# Patient Record
Sex: Male | Born: 1995 | Race: Black or African American | Hispanic: No | Marital: Single | Smoking: Current some day smoker
Health system: Southern US, Community
[De-identification: ages and names within clinical notes are randomized; demographics above are authoritative.]

---

## 2018-05-04 ENCOUNTER — Emergency Department: Payer: 59

## 2018-05-04 ENCOUNTER — Emergency Department
Admission: EM | Admit: 2018-05-04 | Discharge: 2018-05-04 | Disposition: A | Discharge status: Home / Self Care | Payer: 59 | Attending: Emergency Medicine | Admitting: Emergency Medicine

## 2018-05-04 DIAGNOSIS — F17200 Nicotine dependence, unspecified, uncomplicated: Secondary | ICD-10-CM | POA: Insufficient documentation

## 2018-05-04 DIAGNOSIS — J069 Acute upper respiratory infection, unspecified: Secondary | ICD-10-CM | POA: Insufficient documentation

## 2018-05-04 DIAGNOSIS — R0602 Shortness of breath: Secondary | ICD-10-CM | POA: Diagnosis present

## 2018-05-04 LAB — CBC
HEMATOCRIT: 41.3 % (ref 40.0–52.0)
HEMOGLOBIN: 13.8 g/dL (ref 13.0–18.0)
MCH: 27 pg (ref 26.0–34.0)
MCHC: 33.6 g/dL (ref 32.0–36.0)
MCV: 80.6 fL (ref 80.0–100.0)
Platelets: 172 10*3/uL (ref 150–440)
RBC: 5.12 MIL/uL (ref 4.40–5.90)
RDW: 13 % (ref 11.5–14.5)
WBC: 9.9 10*3/uL (ref 3.8–10.6)

## 2018-05-04 LAB — BASIC METABOLIC PANEL
ANION GAP: 13 (ref 5–15)
BUN: 13 mg/dL (ref 6–20)
CO2: 24 mmol/L (ref 22–32)
Calcium: 9.4 mg/dL (ref 8.9–10.3)
Chloride: 101 mmol/L (ref 101–111)
Creatinine, Ser: 0.93 mg/dL (ref 0.61–1.24)
GFR calc Af Amer: >60 mL/min (ref >60)
GLUCOSE: 81 mg/dL (ref 65–99)
Potassium: 3.4 mmol/L — ABNORMAL LOW (ref 3.5–5.1)
Sodium: 138 mmol/L (ref 135–145)

## 2018-05-04 LAB — TROPONIN I: Troponin I: <0.03 ng/mL (ref <0.03)

## 2018-05-04 MED ORDER — AMOXICILLIN-POT CLAVULANATE 875-125 MG PO TABS: 1.0000 | ORAL_TABLET | Freq: Two times a day (BID) | ORAL | 0 refills | Status: AC

## 2018-05-04 MED ORDER — ALBUTEROL SULFATE (2.5 MG/3ML) 0.083% IN NEBU
INHALATION_SOLUTION | RESPIRATORY_TRACT | Status: AC
  Administered 2018-05-04: 5 mg via RESPIRATORY_TRACT
  Filled 2018-05-04: qty 6

## 2018-05-04 MED ORDER — ALBUTEROL SULFATE (2.5 MG/3ML) 0.083% IN NEBU
5.0000 mg | INHALATION_SOLUTION | Freq: Once | RESPIRATORY_TRACT | Status: AC
  Administered 2018-05-04: 5 mg via RESPIRATORY_TRACT

## 2018-05-04 MED ORDER — PREDNISONE 20 MG PO TABS: 40.0000 mg | ORAL_TABLET | Freq: Every day | ORAL | 0 refills | Status: DC

## 2018-05-04 NOTE — ED Triage Notes (Signed)
Patient c/o SOB, cough, congestion, and chest tightness with deep inspiration.

## 2018-05-04 NOTE — ED Provider Notes (Signed)
[facility] Emergency Department Provider Note  Time seen: 10:10 PM  I have reviewed the triage vital signs and the nursing notes.   HISTORY  Chief Complaint Shortness of Breath    HPI Marc Roberts is a 22 y.o. male with no past medical history who presents to the emergency department for cough congestion and chest tightness.  According to the patient for the past 3 4 days he has been experiencing shortness of breath, congestion and occasional wheeze.  Patient states significant nasal congestion, chills at home but has not measured a fever.  Patient states for the last 2 days he has been having some chest tightness, feels like he cannot get a full deep breath of air.  Denies any chest pain.  Largely negative review of systems otherwise.  History reviewed. No pertinent past medical history.  There are no active problems to display for this patient.   History reviewed. No pertinent surgical history.  Prior to Admission medications   Not on File    No Known Allergies  No family history on file.  Social History Social History   Tobacco Use  . Smoking status: Current Some Day Smoker  . Smokeless tobacco: Never Used  Substance Use Topics  . Alcohol use: Never    Frequency: Never  . Drug use: Not on file    Review of Systems Constitutional: Negative for fever.  Positive for chills Eyes: Negative for visual complaints ENT: Positive for congestion Cardiovascular: Negative for chest pain.  Positive for chest tightness Respiratory: Mild shortness of breath, frequent cough. Gastrointestinal: Negative for abdominal pain, vomiting Genitourinary: Negative for urinary compaints Musculoskeletal: Negative for leg pain or swelling Skin: Negative for skin complaints  Neurological: Negative for headache All other ROS negative  ____________________________________________   PHYSICAL EXAM:  Constitutional: Alert and oriented. Well appearing and in no  distress. Eyes: Normal exam ENT   Head: Normocephalic and atraumatic   Mouth/Throat: Mucous membranes are moist. Cardiovascular: Normal rate, regular rhythm. No murmur Respiratory: Normal respiratory effort without tachypnea nor retractions. Breath sounds are clear (status post breathing treatment) Gastrointestinal: Soft and nontender. No distention.   Musculoskeletal: Nontender with normal range of motion in all extremities. No lower extremity tenderness or edema. Neurologic:  Normal speech and language. No gross focal neurologic deficits  Skin:  Skin is warm, dry and intact.  Psychiatric: Mood and affect are normal.   ____________________________________________    EKG  EKG reviewed and interpreted by myself shows normal sinus rhythm 86 bpm with a narrow QRS, normal axis, normal intervals, no concerning ST changes.  ____________________________________________    RADIOLOGY  Normal chest x-ray  ____________________________________________   INITIAL IMPRESSION / ASSESSMENT AND PLAN / ED COURSE  Pertinent labs & imaging results that were available during my care of the patient were reviewed by me and considered in my medical decision making (see chart for details).  Patient presents to the emergency department for shortness of breath cough and congestion.  Differential would include upper respiratory infection, reactive airway disease, sinusitis.  Overall patient appears well, has clear lung sounds after breathing treatment, but was complaining of wheeze at home we will cover with steroids for possible reactive airway disease.  Given the patient's congestion chills and continued cough we will cover with a short course of antibiotics as a precaution.  Overall the patient does appear well we will have the patient follow-up with his primary care doctor.  Patient and family agreeable to plan of care.  Patient's  labs including cardiac enzymes are normal.  Chest x-ray is normal.   EKG is reassuring.  ____________________________________________   FINAL CLINICAL IMPRESSION(S) / ED DIAGNOSES  Upper respiratory infection Dyspnea    Minna Antis, MD 05/04/18 2213

## 2018-05-16 DIAGNOSIS — J329 Chronic sinusitis, unspecified: Secondary | ICD-10-CM | POA: Diagnosis not present

## 2018-05-16 DIAGNOSIS — F17200 Nicotine dependence, unspecified, uncomplicated: Secondary | ICD-10-CM | POA: Insufficient documentation

## 2018-05-16 DIAGNOSIS — R51 Headache: Secondary | ICD-10-CM | POA: Diagnosis present

## 2018-05-16 DIAGNOSIS — Z79899 Other long term (current) drug therapy: Secondary | ICD-10-CM | POA: Insufficient documentation

## 2018-05-16 NOTE — ED Triage Notes (Signed)
Pt presents via POV c/o right orbital pain extending into the back of skull x4 days. Reports pain as constant. Pt reports taking Aleve without resolution. Ambulatory to triage. Denies blurred vision/diziness.

## 2018-05-17 ENCOUNTER — Emergency Department
Admission: EM | Admit: 2018-05-17 | Discharge: 2018-05-17 | Disposition: A | Discharge status: Home / Self Care | Payer: 59 | Attending: Emergency Medicine | Admitting: Emergency Medicine

## 2018-05-17 ENCOUNTER — Emergency Department: Payer: 59

## 2018-05-17 DIAGNOSIS — R519 Headache, unspecified: Secondary | ICD-10-CM

## 2018-05-17 DIAGNOSIS — R51 Headache: Secondary | ICD-10-CM

## 2018-05-17 MED ORDER — AMOXICILLIN-POT CLAVULANATE 875-125 MG PO TABS: 1.0000 | ORAL_TABLET | Freq: Two times a day (BID) | ORAL | 0 refills | Status: AC

## 2018-05-17 NOTE — ED Notes (Signed)
Patient ambulatory to lobby with steady gait and NAD noted. Verbalized understanding of discharge instructions and follow-up care.  

## 2018-05-18 NOTE — ED Provider Notes (Signed)
[facility] Emergency Department Provider Note    First MD Initiated Contact with Patient 05/17/18 0113     (approximate)  I have reviewed the triage vital signs and the nursing notes.   HISTORY  Chief Complaint Headache and Facial Pain    HPI Marc Roberts is a 22 y.o. male presents to the emergency department with a 4-day history of right periorbital headache extending to the occipital region.  Patient denies any fever no neck pain or stiffness.  Patient denies any visual changes.  Patient denies any tearing from the right eye.  Patient denies any weakness numbness gait instability.  Patient's parents at bedside denies any familial history of migraine aneurysms or tumors.  Patient does admit to recent congestion   Past medical history None There are no active problems to display for this patient.   History reviewed. No pertinent surgical history.  Prior to Admission medications   Medication Sig Start Date End Date Taking? Authorizing Provider  amoxicillin-clavulanate (AUGMENTIN) 875-125 MG tablet Take 1 tablet by mouth 2 (two) times daily for 10 days. 05/17/18 05/27/18  Darci Current, MD  predniSONE (DELTASONE) 20 MG tablet Take 2 tablets (40 mg total) by mouth daily. 05/04/18   Minna Antis, MD    Allergies No known drug allergies History reviewed. No pertinent family history.  Social History Social History   Tobacco Use  . Smoking status: Current Some Day Smoker  . Smokeless tobacco: Never Used  Substance Use Topics  . Alcohol use: Never    Frequency: Never  . Drug use: Not on file    Review of Systems Constitutional: No fever/chills Eyes: No visual changes. ENT: No sore throat. Cardiovascular: Denies chest pain. Respiratory: Denies shortness of breath. Gastrointestinal: No abdominal pain.  No nausea, no vomiting.  No diarrhea.  No constipation. Genitourinary: Negative for dysuria. Musculoskeletal: Negative for neck pain.   Negative for back pain. Integumentary: Negative for rash. Neurological: Positive for headaches, negative for focal weakness or numbness.   ____________________________________________   PHYSICAL EXAM:  VITAL SIGNS: ED Triage Vitals  Enc Vitals Group     BP 05/16/18 1934 125/73     Pulse Rate 05/16/18 1934 (!) 57     Resp 05/16/18 1934 14     Temp 05/16/18 1934 98.5 F (36.9 C)     Temp src --      SpO2 05/16/18 1934 100 %     Weight 05/16/18 1935 61.2 kg (135 lb)     Height 05/16/18 1935 1.727 m ( )     Head Circumference --      Peak Flow --      Pain Score 05/16/18 1934 10     Pain Loc --      Pain Edu? --      Excl. in GC? --     Constitutional: Alert and oriented. Well appearing and in no acute distress. Eyes: Conjunctivae are normal. PERRL. EOMI. Head: Atraumatic. Mouth/Throat: Mucous membranes are moist. Oropharynx non-erythematous. Neck: No stridor.  No meningeal signs.  Cardiovascular: Normal rate, regular rhythm. Good peripheral circulation. Grossly normal heart sounds. Respiratory: Normal respiratory effort.  No retractions. Lungs CTAB. Gastrointestinal: Soft and nontender. No distention.  Musculoskeletal: No lower extremity tenderness nor edema. No gross deformities of extremities. Neurologic:  Normal speech and language. No gross focal neurologic deficits are appreciated.  Skin:  Skin is warm, dry and intact. No rash noted. Psychiatric: Mood and affect are normal. Speech and behavior are normal.  _________________________________________  RADIOLOGY I, Darci Current, personally viewed and evaluated these images (plain radiographs) as part of my medical decision making, as well as reviewing the written report by the radiologist.  ED MD interpretation: CT scan of the head revealed partial opacification of the right frontal and left sphenoid sinus radiologist  Official radiology report(s): No results  found.    Procedures   ____________________________________________   INITIAL IMPRESSION / ASSESSMENT AND PLAN / ED COURSE  As part of my medical decision making, I reviewed the following data within the electronic MEDICAL RECORD NUMBER   22 year old male presented with above-stated history and physical exam concerning for sinus headache versus migraine versus other potential intracranial pathology and as such CT scan of the head was performed which revealed opacification of the right frontal sinus consistent with the area of the patient's headache.  Spoke with the patient and his parents at length regarding sinus headaches ____________________________________________  FINAL CLINICAL IMPRESSION(S) / ED DIAGNOSES  Final diagnoses:  Sinus headache     MEDICATIONS GIVEN DURING THIS VISIT:  Medications - No data to display   ED Discharge Orders        Ordered    amoxicillin-clavulanate (AUGMENTIN) 875-125 MG tablet  2 times daily     05/17/18 0217       Note:  This document was prepared using Dragon voice recognition software and may include unintentional dictation errors.    Darci Current, MD 05/18/18 [phone]

## 2020-12-30 ENCOUNTER — Other Ambulatory Visit: Payer: Self-pay

## 2020-12-30 ENCOUNTER — Encounter (HOSPITAL_COMMUNITY): Payer: Self-pay

## 2020-12-30 ENCOUNTER — Emergency Department (HOSPITAL_COMMUNITY)
Admission: EM | Admit: 2020-12-30 | Discharge: 2020-12-30 | Disposition: A | Discharge status: Home / Self Care | Payer: 59 | Attending: Emergency Medicine | Admitting: Emergency Medicine

## 2020-12-30 DIAGNOSIS — Z5321 Procedure and treatment not carried out due to patient leaving prior to being seen by health care provider: Secondary | ICD-10-CM | POA: Insufficient documentation

## 2020-12-30 DIAGNOSIS — R21 Rash and other nonspecific skin eruption: Secondary | ICD-10-CM | POA: Diagnosis not present

## 2020-12-30 NOTE — ED Triage Notes (Signed)
Pt sts dry itchy skin around head, neck and spreading to arms. Pt concerned of psoriasis.

## 2020-12-30 NOTE — ED Notes (Signed)
Pt called 3x for room placement. Eloped from waiting area.  

## 2022-05-28 ENCOUNTER — Emergency Department
Admission: EM | Admit: 2022-05-28 | Discharge: 2022-05-28 | Disposition: A | Discharge status: Home / Self Care | Payer: 59 | Attending: Emergency Medicine | Admitting: Emergency Medicine

## 2022-05-28 ENCOUNTER — Emergency Department: Payer: 59

## 2022-05-28 DIAGNOSIS — M79661 Pain in right lower leg: Secondary | ICD-10-CM | POA: Insufficient documentation

## 2022-05-28 DIAGNOSIS — S62616A Displaced fracture of proximal phalanx of right little finger, initial encounter for closed fracture: Secondary | ICD-10-CM | POA: Insufficient documentation

## 2022-05-28 DIAGNOSIS — R519 Headache, unspecified: Secondary | ICD-10-CM | POA: Insufficient documentation

## 2022-05-28 DIAGNOSIS — Y92410 Unspecified street and highway as the place of occurrence of the external cause: Secondary | ICD-10-CM | POA: Diagnosis not present

## 2022-05-28 DIAGNOSIS — S161XXA Strain of muscle, fascia and tendon at neck level, initial encounter: Secondary | ICD-10-CM | POA: Insufficient documentation

## 2022-05-28 DIAGNOSIS — M79604 Pain in right leg: Secondary | ICD-10-CM

## 2022-05-28 DIAGNOSIS — S6991XA Unspecified injury of right wrist, hand and finger(s), initial encounter: Secondary | ICD-10-CM | POA: Diagnosis present

## 2022-05-28 DIAGNOSIS — S62619A Displaced fracture of proximal phalanx of unspecified finger, initial encounter for closed fracture: Secondary | ICD-10-CM

## 2022-05-28 MED ORDER — NAPROXEN 500 MG PO TABS: 500.0000 mg | ORAL_TABLET | Freq: Two times a day (BID) | ORAL | 0 refills | Status: DC

## 2022-05-28 MED ORDER — CYCLOBENZAPRINE HCL 10 MG PO TABS: 10.0000 mg | ORAL_TABLET | Freq: Three times a day (TID) | ORAL | 0 refills | Status: DC | PRN
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
## 2022-11-29 ENCOUNTER — Emergency Department: Payer: 59

## 2022-11-29 ENCOUNTER — Encounter: Payer: Self-pay | Admitting: Emergency Medicine

## 2022-11-29 ENCOUNTER — Emergency Department
Admission: EM | Admit: 2022-11-29 | Discharge: 2022-11-29 | Disposition: A | Discharge status: Home / Self Care | Payer: 59 | Attending: Emergency Medicine | Admitting: Emergency Medicine

## 2022-11-29 DIAGNOSIS — R0981 Nasal congestion: Secondary | ICD-10-CM | POA: Diagnosis present

## 2022-11-29 DIAGNOSIS — Z1152 Encounter for screening for COVID-19: Secondary | ICD-10-CM | POA: Diagnosis not present

## 2022-11-29 DIAGNOSIS — J101 Influenza due to other identified influenza virus with other respiratory manifestations: Secondary | ICD-10-CM | POA: Insufficient documentation

## 2022-11-29 LAB — RESP PANEL BY RT-PCR (FLU A&B, COVID) ARPGX2
Influenza A by PCR: POSITIVE — AB
Influenza B by PCR: NEGATIVE
SARS Coronavirus 2 by RT PCR: NEGATIVE

## 2022-11-29 MED ORDER — ACETAMINOPHEN 500 MG PO TABS
1000.0000 mg | ORAL_TABLET | Freq: Once | ORAL | Status: AC
  Administered 2022-11-29: 1000 mg via ORAL
  Filled 2022-11-29: qty 2

## 2022-11-29 MED ORDER — ONDANSETRON 4 MG PO TBDP: 4.0000 mg | ORAL_TABLET | Freq: Three times a day (TID) | ORAL | 0 refills | Status: AC | PRN

## 2022-11-29 MED ORDER — BENZONATATE 100 MG PO CAPS: 100.0000 mg | ORAL_CAPSULE | Freq: Three times a day (TID) | ORAL | 0 refills | Status: AC | PRN

## 2022-11-29 NOTE — ED Provider Notes (Signed)
[facility] Provider Note  Patient Contact: 10:39 PM (approximate)   History   Nasal Congestion   HPI  Marc Roberts is a 26 y.o. male presents to the emergency department with headache, body aches, nausea and vomiting for the past 2 days.  No chest pain, chest tightness or abdominal pain.  No shortness of breath.  No sick contacts in the home with similar symptoms.      Physical Exam   Triage Vital Signs: ED Triage Vitals  Enc Vitals Group     BP 11/29/22 1912 (!) 155/68     Pulse Rate 11/29/22 1912 100     Resp 11/29/22 1912 20     Temp 11/29/22 1912 (!) 102 F (38.9 C)     Temp src --      SpO2 11/29/22 1912 100 %     Weight 11/29/22 1907 150 lb (68 kg)     Height 11/29/22 1907 5\' 8"  (1.727 m)     Head Circumference --      Peak Flow --      Pain Score 11/29/22 1912 6     Pain Loc --      Pain Edu? --      Excl. in GC? --     Most recent vital signs: Vitals:   11/29/22 1912  BP: (!) 155/68  Pulse: 100  Resp: 20  Temp: (!) 102 F (38.9 C)  SpO2: 100%     Constitutional: Alert and oriented. Patient is lying supine. Eyes: Conjunctivae are normal. PERRL. EOMI. Head: Atraumatic. ENT:      Ears: Tympanic membranes are mildly injected with mild effusion bilaterally.       Nose: No congestion/rhinnorhea.      Mouth/Throat: Mucous membranes are moist. Posterior pharynx is mildly erythematous.  Hematological/Lymphatic/Immunilogical: No cervical lymphadenopathy.  Cardiovascular: Normal rate, regular rhythm. Normal S1 and S2.  Good peripheral circulation. Respiratory: Normal respiratory effort without tachypnea or retractions. Lungs CTAB. Good air entry to the bases with no decreased or absent breath sounds. Gastrointestinal: Bowel sounds 4 quadrants. Soft and nontender to palpation. No guarding or rigidity. No palpable masses. No distention. No CVA tenderness. Musculoskeletal: Full range of motion to all extremities. No gross  deformities appreciated. Neurologic:  Normal speech and language. No gross focal neurologic deficits are appreciated.  Skin:  Skin is warm, dry and intact. No rash noted. Psychiatric: Mood and affect are normal. Speech and behavior are normal. Patient exhibits appropriate insight and judgement.   ED Results / Procedures / Treatments   Labs (all labs ordered are listed, but only abnormal results are displayed) Labs Reviewed  RESP PANEL BY RT-PCR (FLU A&B, COVID) ARPGX2 - Abnormal; Notable for the following components:      Result Value   Influenza A by PCR POSITIVE (*)    All other components within normal limits        RADIOLOGY  I personally viewed and evaluated these images as part of my medical decision making, as well as reviewing the written report by the radiologist.  ED Provider Interpretation: No acute abnormality on chest x-ray.   PROCEDURES:  Critical Care performed: No  Procedures   MEDICATIONS ORDERED IN ED: Medications  acetaminophen (TYLENOL) tablet 1,000 mg (1,000 mg Oral Given 11/29/22 1910)     IMPRESSION / MDM / ASSESSMENT AND PLAN / ED COURSE  I reviewed the triage vital signs and the nursing notes.  Assessment and plan Influenza A 26 year old male presents to the emergency department with flulike symptoms.  He tested positive for influenza A.  Patient was hypertensive at triage febrile with mild tachycardia.  Vitals improved with antipyretics given in the emergency department.  Recommended Zofran, Tessalon Perles, Tylenol and ibuprofen alternating for fever and bodyaches.  Return precautions were given to return with new or worsening symptoms.      FINAL CLINICAL IMPRESSION(S) / ED DIAGNOSES   Final diagnoses:  Influenza A     Rx / DC Orders   ED Discharge Orders          Ordered    ondansetron (ZOFRAN-ODT) 4 MG disintegrating tablet  Every 8 hours PRN        11/29/22 2236    benzonatate (TESSALON  PERLES) 100 MG capsule  3 times daily PRN        11/29/22 2236             Note:  This document was prepared using Dragon voice recognition software and may include unintentional dictation errors.   Pia [place], PA-C 11/29/22 2241    Sharman Cheek, MD 11/29/22 2308

## 2022-11-29 NOTE — Discharge Instructions (Signed)
Alternate Tylenol and ibuprofen for fever. You can take Tessalon Perles for cough. Take ODT Zofran for nausea and vomiting.

## 2022-11-29 NOTE — ED Provider Triage Note (Signed)
Emergency Medicine Provider Triage Evaluation Note  Marc Roberts , a 26 y.o. male  was evaluated in triage.  Pt complains of cough, body aches, fever  x 2 days.  Review of Systems  Positive: Cough, body aches, congestion, fever Negative: CP, shob  Physical Exam  BP (!) 155/68 (BP Location: Left Arm)   Pulse 100   Temp (!) 102 F (38.9 C)   Resp 20   Ht 5\' 8"  (1.727 m)   Wt 68 kg   SpO2 100%   BMI 22.81 kg/m  Gen:   Awake, no distress   Resp:  Normal effort  MSK:   Moves extremities without difficulty  Other:    Medical Decision Making  Medically screening exam initiated at 7:13 PM.  Appropriate orders placed.  Blanchard Willhite was informed that the remainder of the evaluation will be completed by another provider, this initial triage assessment does not replace that evaluation, and the importance of remaining in the ED until their evaluation is complete.  Swab, xray   Frederico Hamman, PA-C 11/29/22 1913

## 2022-11-29 NOTE — ED Triage Notes (Signed)
Pt presents via POV with complaints of nasal congestion, cough, general body aches since last night. No meds taken PTA. Denies CP or SOB.
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
## 2023-03-14 ENCOUNTER — Ambulatory Visit: Payer: 59 | Admitting: Family

## 2023-03-24 ENCOUNTER — Encounter: Payer: Self-pay | Admitting: Family

## 2023-03-24 ENCOUNTER — Ambulatory Visit: Payer: 59 | Admitting: Family

## 2023-03-24 VITALS — BP 116/76 | HR 67 | Temp 98.0°F | Ht 68.0 in | Wt 143.1 lb

## 2023-03-24 DIAGNOSIS — M5412 Radiculopathy, cervical region: Secondary | ICD-10-CM

## 2023-03-24 DIAGNOSIS — M5416 Radiculopathy, lumbar region: Secondary | ICD-10-CM

## 2023-03-24 MED ORDER — NAPROXEN 500 MG PO TABS: 500.0000 mg | ORAL_TABLET | Freq: Two times a day (BID) | ORAL | 0 refills | Status: DC

## 2023-03-24 MED ORDER — CYCLOBENZAPRINE HCL 10 MG PO TABS: 10.0000 mg | ORAL_TABLET | Freq: Three times a day (TID) | ORAL | 0 refills | Status: DC | PRN

## 2023-03-24 NOTE — Patient Instructions (Addendum)
Welcome to [facility] [employer], It was a pleasure meeting you today!    As discussed, I have sent your pain med refills to your pharmacy.  I have sent a referral to [facility] in [place].    PLEASE NOTE: If you had any LAB tests please let us know if you have not heard back within a few days. You may see your results on MyChart before we have a chance to review them but we will give you a call once they are reviewed by [place]. If we ordered any REFERRALS today, please let us know if you have not heard from their office within the next week.  Let us know through MyChart if you are needing REFILLS, or have your pharmacy send [place] the request. You can also use MyChart to communicate with me or any office staff.  Please try these tips to maintain a healthy lifestyle: It is important that you exercise regularly at least 30 minutes 5 times a week. Think about what you will eat, plan ahead. Choose whole foods, & think  "clean, green, fresh or frozen" over canned, processed or packaged foods which are more sugary, salty, and fatty. 70 to 75% of food eaten should be fresh vegetables and protein. 2-3  meals daily with healthy snacks between meals, but must be whole fruit, protein or vegetables. Aim to eat over a 10 hour period when you are active, for example, 7am to 5pm, and then STOP after your last meal of the day, drinking only water.  Shorter eating windows, 6-8 hours, are showing benefits in heart disease and blood sugar regulation. Drink water every day! Shoot for 64 ounces daily = 8 cups, no other drink is as healthy! Fruit juice is best enjoyed in a healthy way, by EATING the fruit.
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
## 2023-05-15 ENCOUNTER — Emergency Department (HOSPITAL_COMMUNITY): Payer: 59

## 2023-05-15 ENCOUNTER — Other Ambulatory Visit: Payer: Self-pay

## 2023-05-15 ENCOUNTER — Encounter (HOSPITAL_COMMUNITY): Payer: Self-pay | Admitting: *Deleted

## 2023-05-15 ENCOUNTER — Emergency Department (HOSPITAL_COMMUNITY)
Admission: EM | Admit: 2023-05-15 | Discharge: 2023-05-15 | Disposition: A | Discharge status: Home / Self Care | Payer: 59 | Attending: Emergency Medicine | Admitting: Emergency Medicine

## 2023-05-15 DIAGNOSIS — J9801 Acute bronchospasm: Secondary | ICD-10-CM | POA: Diagnosis not present

## 2023-05-15 DIAGNOSIS — R0602 Shortness of breath: Secondary | ICD-10-CM | POA: Diagnosis present

## 2023-05-15 LAB — BASIC METABOLIC PANEL
Anion gap: 11 (ref 5–15)
BUN: 10 mg/dL (ref 6–20)
CO2: 24 mmol/L (ref 22–32)
Calcium: 9.1 mg/dL (ref 8.9–10.3)
Chloride: 105 mmol/L (ref 98–111)
Creatinine, Ser: 0.89 mg/dL (ref 0.61–1.24)
GFR, Estimated: >60 mL/min (ref >60)
Glucose, Bld: 95 mg/dL (ref 70–99)
Potassium: 3.7 mmol/L (ref 3.5–5.1)
Sodium: 140 mmol/L (ref 135–145)

## 2023-05-15 LAB — CBC WITH DIFFERENTIAL/PLATELET
Abs Immature Granulocytes: 0.02 10*3/uL (ref 0.00–0.07)
Basophils Absolute: 0.1 10*3/uL (ref 0.0–0.1)
Basophils Relative: 1 %
Eosinophils Absolute: 0.6 10*3/uL — ABNORMAL HIGH (ref 0.0–0.5)
Eosinophils Relative: 7 %
HCT: 43.6 % (ref 39.0–52.0)
Hemoglobin: 13.7 g/dL (ref 13.0–17.0)
Immature Granulocytes: 0 %
Lymphocytes Relative: 36 %
Lymphs Abs: 2.9 10*3/uL (ref 0.7–4.0)
MCH: 26.4 pg (ref 26.0–34.0)
MCHC: 31.4 g/dL (ref 30.0–36.0)
MCV: 84.2 fL (ref 80.0–100.0)
Monocytes Absolute: 0.5 10*3/uL (ref 0.1–1.0)
Monocytes Relative: 6 %
Neutro Abs: 3.8 10*3/uL (ref 1.7–7.7)
Neutrophils Relative %: 50 %
Platelets: 240 10*3/uL (ref 150–400)
RBC: 5.18 MIL/uL (ref 4.22–5.81)
RDW: 13.1 % (ref 11.5–15.5)
WBC: 7.9 10*3/uL (ref 4.0–10.5)
nRBC: 0 % (ref 0.0–0.2)

## 2023-05-15 MED ORDER — ALBUTEROL SULFATE HFA 108 (90 BASE) MCG/ACT IN AERS
2.0000 | INHALATION_SPRAY | Freq: Once | RESPIRATORY_TRACT | Status: AC
  Administered 2023-05-15: 2 via RESPIRATORY_TRACT
  Filled 2023-05-15: qty 6.7

## 2023-05-15 MED ORDER — IPRATROPIUM-ALBUTEROL 0.5-2.5 (3) MG/3ML IN SOLN
3.0000 mL | Freq: Once | RESPIRATORY_TRACT | Status: AC
  Administered 2023-05-15: 3 mL via RESPIRATORY_TRACT
  Filled 2023-05-15: qty 3

## 2023-05-15 NOTE — ED Triage Notes (Signed)
Patient states he was sitting down watching movie started coughing and couldn't stop started getting sob. That was at 6 pm c/o headache , abd. And back pain onset 11pm. States he tried taking dayquil and muconex  with relief. Able to speak in complete sentences no coughing at present

## 2023-05-15 NOTE — ED Notes (Signed)
Patient noted to be pacing in hallway, in no distress.

## 2023-05-15 NOTE — Discharge Instructions (Signed)
Continue using inhaler if needed at home.  Generally 2 puffs at a time.  If still not helping can try 5 puffs which = 1 neb treatment. Follow-up with your primary care doctor. Return here for new concerns.

## 2023-05-15 NOTE — ED Provider Notes (Signed)
[place] EMERGENCY DEPARTMENT AT [facility] Provider Note   CSN: [id number] Arrival date & time: 05/15/23  0419     History  Chief Complaint  Patient presents with   Shortness of Breath    Marc Roberts is a 27 y.o. male.  The history is provided by the patient and medical records.  Shortness of Breath  27 year old male presenting to the ED with shortness of breath.  Earlier this morning was watching a movie when he got into a coughing fit.  States afterwards started having a lot of chest tightness and back pain.  States he feels like he cannot take a deep breath.  He tried taking Mucinex and DayQuil but continues coughing.  He has no history of asthma but mother reports history of recurrent bronchitis as a child.  No recent issues.  Home Medications Prior to Admission medications   Medication Sig Start Date End Date Taking? Authorizing Provider  cyclobenzaprine (FLEXERIL) 10 MG tablet Take 1 tablet (10 mg total) by mouth 3 (three) times daily as needed (Take for pain). 03/24/23   Dulce Sellar, NP  naproxen (NAPROSYN) 500 MG tablet Take 1 tablet (500 mg total) by mouth 2 (two) times daily with a meal. 03/24/23   Dulce Sellar, NP      Allergies    Patient has no known allergies.    Review of Systems   Review of Systems  Respiratory:  Positive for shortness of breath.   All other systems reviewed and are negative.   Physical Exam Updated Vital Signs BP (!) 138/94 (BP Location: Right Arm)   Pulse 74   Temp 98.2 F (36.8 C) (Oral)   Resp 18   Ht 5\' 8"  (1.727 m)   Wt 61.2 kg   SpO2 98%   BMI 20.53 kg/m   Physical Exam Vitals and nursing note reviewed.  Constitutional:      Appearance: He is well-developed.  HENT:     Head: Normocephalic and atraumatic.  Eyes:     Conjunctiva/sclera: Conjunctivae normal.     Pupils: Pupils are equal, round, and reactive to light.  Cardiovascular:     Rate and Rhythm: Normal rate and regular rhythm.      Heart sounds: Normal heart sounds.  Pulmonary:     Effort: Pulmonary effort is normal.     Breath sounds: Wheezing present.     Comments: Diffuse expiratory wheezes, repetitive dry, hacking cough, is able to speak in full sentences Abdominal:     General: Bowel sounds are normal.     Palpations: Abdomen is soft.  Musculoskeletal:        General: Normal range of motion.     Cervical back: Normal range of motion.  Skin:    General: Skin is warm and dry.  Neurological:     Mental Status: He is alert and oriented to person, place, and time.     ED Results / Procedures / Treatments   Labs (all labs ordered are listed, but only abnormal results are displayed) Labs Reviewed  CBC WITH DIFFERENTIAL/PLATELET - Abnormal; Notable for the following components:      Result Value   Eosinophils Absolute 0.6 (*)    All other components within normal limits  BASIC METABOLIC PANEL    EKG None  Radiology DG Chest 2 View  Result Date: 05/15/2023 CLINICAL DATA:  Cough with difficulty breathing. EXAM: CHEST - 2 VIEW COMPARISON:  11/29/2022 FINDINGS: Normal heart size and mediastinal contours. No acute infiltrate or  edema. No effusion or pneumothorax. No acute osseous findings. IMPRESSION: Negative chest. Electronically Signed   By: Tiburcio Pea M.D.   On: 05/15/2023 05:10    Procedures Procedures    Medications Ordered in ED Medications  albuterol (VENTOLIN HFA) 108 (90 Base) MCG/ACT inhaler 2 puff (has no administration in time range)  ipratropium-albuterol (DUONEB) 0.5-2.5 (3) MG/3ML nebulizer solution 3 mL (3 mLs Nebulization Given 05/15/23 9147)    ED Course/ Medical Decision Making/ A&P                             Medical Decision Making Amount and/or Complexity of Data Reviewed Labs: ordered. Radiology: ordered and independent interpretation performed. ECG/medicine tests: ordered and independent interpretation performed.  Risk Prescription drug management.   41 male  presenting to the ED for shortness of breath.  Got into a coughing fit earlier and now has chest tightness and feels like he cannot catch his breath.  He is afebrile and nontoxic in appearance.  He is in no acute respiratory distress.  He does have some expiratory wheezes on exam.  Chest x-ray is negative for any acute findings.  Labs are reassuring.  Will give DuoNeb and reassess.  6:19 AM Lung sounds have cleared after neb.  Reports he is feeling better.  Suspect symptoms related to bronchospasm.  No fever or other infectious symptoms.  No sick contacts.  Given albuterol inhaler here for continued home use PRN.  Stable for discharge.  Can return here for any new/acute changes.  Final Clinical Impression(s) / ED Diagnoses Final diagnoses:  Bronchospasm    Rx / DC Orders ED Discharge Orders     None         Garlon Hatchet, PA-C 05/15/23 Ulis Rias    Shon Baton, MD 05/15/23 [phone]

## 2023-07-17 ENCOUNTER — Encounter (HOSPITAL_COMMUNITY): Payer: Self-pay | Admitting: Emergency Medicine

## 2023-07-17 ENCOUNTER — Other Ambulatory Visit: Payer: Self-pay

## 2023-07-17 ENCOUNTER — Emergency Department (HOSPITAL_COMMUNITY)
Admission: EM | Admit: 2023-07-17 | Discharge: 2023-07-17 | Discharge status: Against Medical Advice | Payer: 59 | Attending: Emergency Medicine | Admitting: Emergency Medicine

## 2023-07-17 ENCOUNTER — Emergency Department (HOSPITAL_COMMUNITY): Payer: 59

## 2023-07-17 DIAGNOSIS — R0989 Other specified symptoms and signs involving the circulatory and respiratory systems: Secondary | ICD-10-CM | POA: Insufficient documentation

## 2023-07-17 DIAGNOSIS — Z5321 Procedure and treatment not carried out due to patient leaving prior to being seen by health care provider: Secondary | ICD-10-CM | POA: Insufficient documentation

## 2023-07-17 MED ORDER — ALBUTEROL SULFATE HFA 108 (90 BASE) MCG/ACT IN AERS
2.0000 | INHALATION_SPRAY | Freq: Once | RESPIRATORY_TRACT | Status: AC
  Administered 2023-07-17: 2 via RESPIRATORY_TRACT
  Filled 2023-07-17: qty 6.7

## 2023-07-17 NOTE — ED Notes (Signed)
Called name 3x no answer 

## 2023-07-17 NOTE — ED Triage Notes (Signed)
Pt c/o chest congestion since 5pm yesterday, states that it occurred after he was sweeping in a garage.

## 2023-07-25 ENCOUNTER — Ambulatory Visit: Payer: 59 | Admitting: Family

## 2023-07-25 DIAGNOSIS — M5416 Radiculopathy, lumbar region: Secondary | ICD-10-CM | POA: Diagnosis not present

## 2023-07-25 DIAGNOSIS — M5412 Radiculopathy, cervical region: Secondary | ICD-10-CM

## 2023-07-25 MED ORDER — NAPROXEN 500 MG PO TABS
500.0000 mg | ORAL_TABLET | Freq: Two times a day (BID) | ORAL | 1 refills | Status: AC
Start: 2023-07-25 — End: ?

## 2023-07-25 MED ORDER — CYCLOBENZAPRINE HCL 10 MG PO TABS
10.0000 mg | ORAL_TABLET | Freq: Three times a day (TID) | ORAL | 1 refills | Status: AC | PRN
Start: 2023-07-25 — End: ?
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
## 2023-08-01 ENCOUNTER — Encounter (HOSPITAL_COMMUNITY): Payer: Self-pay

## 2023-08-01 ENCOUNTER — Emergency Department (HOSPITAL_COMMUNITY): Payer: 59

## 2023-08-01 ENCOUNTER — Other Ambulatory Visit: Payer: Self-pay

## 2023-08-01 ENCOUNTER — Emergency Department (HOSPITAL_COMMUNITY)
Admission: EM | Admit: 2023-08-01 | Discharge: 2023-08-01 | Disposition: A | Discharge status: Home / Self Care | Payer: 59 | Attending: Emergency Medicine | Admitting: Emergency Medicine

## 2023-08-01 DIAGNOSIS — J329 Chronic sinusitis, unspecified: Secondary | ICD-10-CM

## 2023-08-01 DIAGNOSIS — J019 Acute sinusitis, unspecified: Secondary | ICD-10-CM | POA: Insufficient documentation

## 2023-08-01 DIAGNOSIS — R0602 Shortness of breath: Secondary | ICD-10-CM | POA: Diagnosis present

## 2023-08-01 LAB — CBC WITH DIFFERENTIAL/PLATELET
Abs Immature Granulocytes: 0 10*3/uL (ref 0.00–0.07)
Basophils Absolute: 0.1 10*3/uL (ref 0.0–0.1)
Basophils Relative: 2 %
Eosinophils Absolute: 0.7 10*3/uL — ABNORMAL HIGH (ref 0.0–0.5)
Eosinophils Relative: 11 %
HCT: 42.1 % (ref 39.0–52.0)
Hemoglobin: 13.2 g/dL (ref 13.0–17.0)
Immature Granulocytes: 0 %
Lymphocytes Relative: 48 %
Lymphs Abs: 2.9 10*3/uL (ref 0.7–4.0)
MCH: 26.5 pg (ref 26.0–34.0)
MCHC: 31.4 g/dL (ref 30.0–36.0)
MCV: 84.5 fL (ref 80.0–100.0)
Monocytes Absolute: 0.4 10*3/uL (ref 0.1–1.0)
Monocytes Relative: 6 %
Neutro Abs: 2 10*3/uL (ref 1.7–7.7)
Neutrophils Relative %: 33 %
Platelets: 216 10*3/uL (ref 150–400)
RBC: 4.98 MIL/uL (ref 4.22–5.81)
RDW: 12.7 % (ref 11.5–15.5)
WBC: 6.1 10*3/uL (ref 4.0–10.5)
nRBC: 0 % (ref 0.0–0.2)

## 2023-08-01 LAB — BASIC METABOLIC PANEL
Anion gap: 11 (ref 5–15)
BUN: 11 mg/dL (ref 6–20)
CO2: 23 mmol/L (ref 22–32)
Calcium: 9.3 mg/dL (ref 8.9–10.3)
Chloride: 103 mmol/L (ref 98–111)
Creatinine, Ser: 0.8 mg/dL (ref 0.61–1.24)
GFR, Estimated: >60 mL/min (ref >60)
Glucose, Bld: 89 mg/dL (ref 70–99)
Potassium: 3.5 mmol/L (ref 3.5–5.1)
Sodium: 137 mmol/L (ref 135–145)

## 2023-08-01 MED ORDER — ALBUTEROL SULFATE HFA 108 (90 BASE) MCG/ACT IN AERS
2.0000 | INHALATION_SPRAY | RESPIRATORY_TRACT | Status: DC | PRN
  Administered 2023-08-01: 2 via RESPIRATORY_TRACT
  Filled 2023-08-01: qty 6.7

## 2023-08-01 NOTE — ED Triage Notes (Signed)
Patient arrived POV from home with complaint of congestion at night when laying down, cough, get SOB going up stairs. Also reports chest pain 7/10 with deep breaths.   Patient reports that has used an inhaler in the past for same but no longer has inhaler.

## 2023-08-01 NOTE — ED Provider Notes (Signed)
[place] EMERGENCY DEPARTMENT AT [facility] Provider Note   CSN: [id number] Arrival date & time: 08/01/23  3235     History  Chief Complaint  Patient presents with   Shortness of Breath    Marc Roberts is a 27 y.o. male.  27 year old male presents today for concern of congestion, cough and some chest soreness with cough.  Symptoms started overnight.  He states currently after the albuterol inhaler he is significantly better.  No prior history of asthma that he is aware of.  He states he had a couple cups of an alcoholic beverage which he had before but did not have this reaction.  No rash, wheezing, nausea, vomiting.  Outside of the congestion he feels at baseline.  The history is provided by the patient. No language interpreter was used.       Home Medications Prior to Admission medications   Medication Sig Start Date End Date Taking? Authorizing Provider  cyclobenzaprine (FLEXERIL) 10 MG tablet Take 1 tablet (10 mg total) by mouth 3 (three) times daily as needed (Take for pain). 07/25/23   Dulce Sellar, NP  naproxen (NAPROSYN) 500 MG tablet Take 1 tablet (500 mg total) by mouth 2 (two) times daily with a meal. 07/25/23   Dulce Sellar, NP      Allergies    Patient has no known allergies.    Review of Systems   Review of Systems  Constitutional:  Negative for chills and fever.  HENT:  Positive for congestion and postnasal drip.   Respiratory:  Positive for cough. Negative for shortness of breath.   Neurological:  Negative for light-headedness.  All other systems reviewed and are negative.   Physical Exam Updated Vital Signs BP (!) 136/102 (BP Location: Right Arm)   Pulse 73   Temp 98.3 F (36.8 C) (Oral)   Resp 16   SpO2 100%  Physical Exam Vitals and nursing note reviewed.  Constitutional:      General: He is not in acute distress.    Appearance: Normal appearance. He is not ill-appearing.  HENT:     Head: Normocephalic and atraumatic.      Nose: Nose normal.  Eyes:     Conjunctiva/sclera: Conjunctivae normal.  Cardiovascular:     Rate and Rhythm: Normal rate.     Heart sounds: Normal heart sounds.  Pulmonary:     Effort: Pulmonary effort is normal. No respiratory distress.     Breath sounds: No wheezing or rales.  Musculoskeletal:        General: No deformity. Normal range of motion.     Cervical back: Normal range of motion.  Skin:    Findings: No rash.  Neurological:     Mental Status: He is alert.     ED Results / Procedures / Treatments   Labs (all labs ordered are listed, but only abnormal results are displayed) Labs Reviewed  CBC WITH DIFFERENTIAL/PLATELET - Abnormal; Notable for the following components:      Result Value   Eosinophils Absolute 0.7 (*)    All other components within normal limits  BASIC METABOLIC PANEL    EKG EKG Interpretation Date/Time:  Monday August 01 2023 04:44:50 EDT Ventricular Rate:  69 PR Interval:  152 QRS Duration:  76 QT Interval:  334 QTC Calculation: 357 R Axis:   86  Text Interpretation: Normal sinus rhythm with sinus arrhythmia Normal ECG When compared with ECG of 15-May-2023 04:16, PREVIOUS ECG IS PRESENT No significant change was found Confirmed  by Glynn Octave [phone]) on 08/01/2023 5:12:19 AM  Radiology DG Chest 2 View  Result Date: 08/01/2023 CLINICAL DATA:  Shortness of breath. EXAM: CHEST - 2 VIEW COMPARISON:  07/09/2023 FINDINGS: The heart size and mediastinal contours are within normal limits. Both lungs are clear. The visualized skeletal structures are unremarkable. IMPRESSION: No active cardiopulmonary disease. Electronically Signed   By: Kennith Center M.D.   On: 08/01/2023 06:04    Procedures Procedures    Medications Ordered in ED Medications  albuterol (VENTOLIN HFA) 108 (90 Base) MCG/ACT inhaler 2 puff (2 puffs Inhalation Given 08/01/23 0513)    ED Course/ Medical Decision Making/ A&P                                 Medical  Decision Making Amount and/or Complexity of Data Reviewed Labs: ordered. Radiology: ordered.  Risk Prescription drug management.   27 year old male with no significant past medical history presents today for concern of cough, congestion, postnasal drip.  Symptoms occurred around 0200 tonight.  His workup is overall reassuring with normal basic metabolic panel, CBC without acute concerns.  Chest x-ray without acute cardiopulmonary process.  EKG without acute ischemic changes.  His heart score is overall low taking into account his troponin has not been drawn.  No past medical history significant for CAD or significant family history of CAD.  Low risk of PE on Wells criteria.  PERC negative.  Likely sinusitis with postnasal drip.  Symptomatic management discussed.  He was given an albuterol inhaler in the emergency department that he we will go home with.  No acute concerns.  He is appropriate for discharge.  He is in agreement with plan.   Final Clinical Impression(s) / ED Diagnoses Final diagnoses:  Sinusitis, unspecified chronicity, unspecified location    Rx / DC Orders ED Discharge Orders     None         Marita [place], PA-C 08/01/23 [id number]    Benjiman Core, MD 08/01/23 1529

## 2023-08-01 NOTE — Discharge Instructions (Signed)
Your blood work, chest x-ray, EKG did not show any concerning findings.  You likely have a sinus infection with postnasal drip causing your symptoms.  This is likely viral.  Perform sinus rinse as discussed.  Information attached above.  You can use Sudafed for brief relief from sinus congestion.  Do not use this too often or for more than 3 days.  Can also use Breathe Right strips to help with the congestion while you sleep.

## 2023-08-01 NOTE — ED Notes (Signed)
This RN reviewed discharge instructions with patient. He verbalized understanding and denied any further questions. PT well appearing upon discharge and reports tolerable pain. Pt ambulated with stable gait to exit. Pt endorses ride home.  

## 2024-04-30 DIAGNOSIS — Z419 Encounter for procedure for purposes other than remedying health state, unspecified: Secondary | ICD-10-CM | POA: Diagnosis not present

## 2024-05-31 DIAGNOSIS — Z419 Encounter for procedure for purposes other than remedying health state, unspecified: Secondary | ICD-10-CM | POA: Diagnosis not present

## 2024-06-30 DIAGNOSIS — Z419 Encounter for procedure for purposes other than remedying health state, unspecified: Secondary | ICD-10-CM | POA: Diagnosis not present

## 2024-07-31 DIAGNOSIS — Z419 Encounter for procedure for purposes other than remedying health state, unspecified: Secondary | ICD-10-CM | POA: Diagnosis not present

## 2024-08-31 DIAGNOSIS — Z419 Encounter for procedure for purposes other than remedying health state, unspecified: Secondary | ICD-10-CM | POA: Diagnosis not present

## 2024-09-14 ENCOUNTER — Encounter: Admitting: Family

## 2024-10-31 DIAGNOSIS — Z419 Encounter for procedure for purposes other than remedying health state, unspecified: Secondary | ICD-10-CM | POA: Diagnosis not present
